# Patient Record
Sex: Female | Born: 1995 | Race: Black or African American | Hispanic: No | Marital: Single | Smoking: Current every day smoker
Health system: Southern US, Community
[De-identification: ages and names within clinical notes are randomized; demographics above are authoritative.]

## PROBLEM LIST (undated history)

## (undated) ENCOUNTER — Emergency Department (HOSPITAL_COMMUNITY): Admission: EM | Payer: Self-pay | Source: Home / Self Care

## (undated) DIAGNOSIS — F909 Attention-deficit hyperactivity disorder, unspecified type: Secondary | ICD-10-CM

---
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
## 2014-02-24 ENCOUNTER — Emergency Department (HOSPITAL_COMMUNITY)
Admission: EM | Admit: 2014-02-24 | Discharge: 2014-02-24 | Disposition: A | Discharge status: Home / Self Care | Payer: Medicaid Other | Attending: Emergency Medicine | Admitting: Emergency Medicine

## 2014-02-24 ENCOUNTER — Emergency Department (HOSPITAL_COMMUNITY): Payer: Medicaid Other

## 2014-02-24 ENCOUNTER — Encounter (HOSPITAL_COMMUNITY): Payer: Self-pay | Admitting: Emergency Medicine

## 2014-02-24 DIAGNOSIS — E860 Dehydration: Secondary | ICD-10-CM

## 2014-02-24 DIAGNOSIS — Z3202 Encounter for pregnancy test, result negative: Secondary | ICD-10-CM | POA: Insufficient documentation

## 2014-02-24 DIAGNOSIS — Z720 Tobacco use: Secondary | ICD-10-CM | POA: Insufficient documentation

## 2014-02-24 DIAGNOSIS — R1110 Vomiting, unspecified: Secondary | ICD-10-CM | POA: Insufficient documentation

## 2014-02-24 DIAGNOSIS — Z8659 Personal history of other mental and behavioral disorders: Secondary | ICD-10-CM | POA: Insufficient documentation

## 2014-02-24 DIAGNOSIS — R109 Unspecified abdominal pain: Secondary | ICD-10-CM

## 2014-02-24 DIAGNOSIS — K529 Noninfective gastroenteritis and colitis, unspecified: Secondary | ICD-10-CM | POA: Diagnosis not present

## 2014-02-24 LAB — CBC WITH DIFFERENTIAL/PLATELET
Basophils Absolute: 0 10*3/uL (ref 0.0–0.1)
Basophils Relative: 0 % (ref 0–1)
EOS PCT: 4 % (ref 0–5)
Eosinophils Absolute: 0.4 10*3/uL (ref 0.0–0.7)
HCT: 38.5 % (ref 36.0–46.0)
HEMOGLOBIN: 13.5 g/dL (ref 12.0–15.0)
LYMPHS ABS: 3.5 10*3/uL (ref 0.7–4.0)
Lymphocytes Relative: 35 % (ref 12–46)
MCH: 30.9 pg (ref 26.0–34.0)
MCHC: 35.1 g/dL (ref 30.0–36.0)
MCV: 88.1 fL (ref 78.0–100.0)
Monocytes Absolute: 0.6 10*3/uL (ref 0.1–1.0)
Monocytes Relative: 6 % (ref 3–12)
NEUTROS ABS: 5.6 10*3/uL (ref 1.7–7.7)
NEUTROS PCT: 55 % (ref 43–77)
PLATELETS: 246 10*3/uL (ref 150–400)
RBC: 4.37 MIL/uL (ref 3.87–5.11)
RDW: 14 % (ref 11.5–15.5)
WBC: 10.1 10*3/uL (ref 4.0–10.5)

## 2014-02-24 LAB — URINALYSIS, ROUTINE W REFLEX MICROSCOPIC
GLUCOSE, UA: NEGATIVE mg/dL
Hgb urine dipstick: NEGATIVE
Ketones, ur: NEGATIVE mg/dL
Nitrite: NEGATIVE
Protein, ur: NEGATIVE mg/dL
Specific Gravity, Urine: 1.026 (ref 1.005–1.030)
UROBILINOGEN UA: 1 mg/dL (ref 0.0–1.0)
pH: 5.5 (ref 5.0–8.0)

## 2014-02-24 LAB — COMPREHENSIVE METABOLIC PANEL
ALBUMIN: 3.6 g/dL (ref 3.5–5.2)
ALK PHOS: 54 U/L (ref 39–117)
ALT: 13 U/L (ref 0–35)
ANION GAP: 6 (ref 5–15)
AST: 20 U/L (ref 0–37)
BUN: 7 mg/dL (ref 6–23)
CO2: 23 mmol/L (ref 19–32)
Calcium: 8.8 mg/dL (ref 8.4–10.5)
Chloride: 108 mEq/L (ref 96–112)
Creatinine, Ser: 0.89 mg/dL (ref 0.50–1.10)
Glucose, Bld: 100 mg/dL — ABNORMAL HIGH (ref 70–99)
POTASSIUM: 3.7 mmol/L (ref 3.5–5.1)
Sodium: 137 mmol/L (ref 135–145)
TOTAL PROTEIN: 6.8 g/dL (ref 6.0–8.3)
Total Bilirubin: 0.6 mg/dL (ref 0.3–1.2)

## 2014-02-24 LAB — LIPASE, BLOOD: Lipase: 291 U/L — ABNORMAL HIGH (ref 11–59)

## 2014-02-24 LAB — URINE MICROSCOPIC-ADD ON

## 2014-02-24 LAB — PREGNANCY, URINE: Preg Test, Ur: NEGATIVE

## 2014-02-24 MED ORDER — ONDANSETRON HCL 4 MG/2ML IJ SOLN
4.0000 mg | Freq: Once | INTRAMUSCULAR | Status: AC
  Administered 2014-02-24: 4 mg via INTRAVENOUS
  Filled 2014-02-24: qty 2

## 2014-02-24 MED ORDER — IOHEXOL 300 MG/ML  SOLN
25.0000 mL | Freq: Once | INTRAMUSCULAR | Status: AC | PRN
  Administered 2014-02-24: 25 mL via ORAL

## 2014-02-24 MED ORDER — PROMETHAZINE HCL 25 MG PO TABS: 25.0000 mg | ORAL_TABLET | Freq: Four times a day (QID) | ORAL | Status: AC | PRN

## 2014-02-24 MED ORDER — MORPHINE SULFATE 4 MG/ML IJ SOLN
4.0000 mg | Freq: Once | INTRAMUSCULAR | Status: AC
  Administered 2014-02-24: 4 mg via INTRAVENOUS
  Filled 2014-02-24: qty 1

## 2014-02-24 MED ORDER — IOHEXOL 300 MG/ML  SOLN
100.0000 mL | Freq: Once | INTRAMUSCULAR | Status: AC | PRN
  Administered 2014-02-24: 100 mL via INTRAVENOUS

## 2014-02-24 MED ORDER — SODIUM CHLORIDE 0.9 % IV BOLUS (SEPSIS)
1000.0000 mL | Freq: Once | INTRAVENOUS | Status: AC
  Administered 2014-02-24: 1000 mL via INTRAVENOUS

## 2014-02-24 MED ORDER — HYDROCODONE-ACETAMINOPHEN 5-325 MG PO TABS: 1.0000 | ORAL_TABLET | Freq: Four times a day (QID) | ORAL | Status: AC | PRN

## 2014-02-24 NOTE — Discharge Instructions (Signed)
Take phenergan for nausea.   Stay hydrated.   Take motrin for pain.   Take vicodin for severe pain.   Follow up with your doctor.   Return to ER if you have worse vomiting, severe pain, fevers.

## 2014-02-24 NOTE — ED Provider Notes (Signed)
CSN: [id number]     Arrival date & time 02/24/14  0202 History  This chart was scribed for Richardean Canal, MD by Murriel Hopper, ED Scribe. This patient was seen in room B19C/B19C and the patient's care was started at 3:05 AM.    Chief Complaint  Patient presents with  . Abdominal Pain    The history is provided by the patient. No language interpreter was used.    HPI Comments: Paula Beck is a 19 y.o. female who presents to the Emergency Department complaining of constant, central abdominal pain with associated vomiting that began yesterday evening while she was sleeping. Pt states that she went to bed at 9 PM yesterday and was awoken by abdominal pain two hours later. Pt states that she vomited once PTA. Pt denies diarrhea, dysuria, or any positive sick contact. Pt states that she smokes cigarettes and marijuana but denies alcohol use.      Past Medical History  Diagnosis Date  . ADHD (attention deficit hyperactivity disorder) 01/30/2012   History reviewed. No pertinent past surgical history. Family History  Problem Relation Age of Onset  . Alcohol abuse Mother   . Drug abuse Mother   . Alcohol abuse Father   . Drug abuse Father   . Drug abuse Maternal Aunt   . Drug abuse Maternal Uncle   . Diabetes Maternal Grandmother    History  Substance Use Topics  . Smoking status: Current Every Day Smoker -- 0.50 packs/day    Types: Cigarettes  . Smokeless tobacco: Never Used  . Alcohol Use: No   OB History    No data available     Review of Systems  Gastrointestinal: Positive for vomiting and abdominal pain. Negative for diarrhea.  Genitourinary: Negative for dysuria.  All other systems reviewed and are negative.     Allergies  Review of patient's allergies indicates no known allergies.  Home Medications   Prior to Admission medications   Medication Sig Start Date End Date Taking? Authorizing Provider  ibuprofen (ADVIL,MOTRIN) 800 MG tablet Take 1 tablet (800 mg  total) by mouth 3 (three) times daily. Patient taking differently: Take 800 mg by mouth every 6 (six) hours as needed for moderate pain.  04/28/13  Yes Jennifer L Piepenbrink, PA-C   BP 108/53 mmHg  Pulse 72  Temp(Src) 97.5 F (36.4 C) (Oral)  Resp 14  SpO2 99%  LMP 01/01/2014 (Approximate) Physical Exam  Constitutional: She is oriented to person, place, and time. She appears well-developed and well-nourished.  HENT:  Head: Normocephalic and atraumatic.  Cardiovascular: Normal rate and regular rhythm.   Pulmonary/Chest: Effort normal.  Abdominal: She exhibits no distension.  Diffusely tender in upper abdomen No rebound Mild periumbilical tenderness   Neurological: She is alert and oriented to person, place, and time.  Skin: Skin is warm and dry.  Psychiatric: She has a normal mood and affect.  Nursing note and vitals reviewed.   ED Course  Procedures (including critical care time)  DIAGNOSTIC STUDIES: Oxygen Saturation is 98% on RA, normal by my interpretation.    COORDINATION OF CARE: 3:08 AM Discussed treatment plan with pt at bedside and pt agreed to plan.   Labs Review Labs Reviewed  COMPREHENSIVE METABOLIC PANEL - Abnormal; Notable for the following:    Glucose, Bld 100 (*)    All other components within normal limits  LIPASE, BLOOD - Abnormal; Notable for the following:    Lipase 291 (*)    All other components within normal  limits  URINALYSIS, ROUTINE W REFLEX MICROSCOPIC - Abnormal; Notable for the following:    APPearance TURBID (*)    Bilirubin Urine SMALL (*)    Leukocytes, UA SMALL (*)    All other components within normal limits  URINE MICROSCOPIC-ADD ON - Abnormal; Notable for the following:    Squamous Epithelial / LPF MANY (*)    Bacteria, UA MANY (*)    All other components within normal limits  CBC WITH DIFFERENTIAL  PREGNANCY, URINE    Imaging Review Ct Abdomen Pelvis W Contrast  02/24/2014   CLINICAL DATA:  Woke up with mid abdominal pain  and vomiting.  EXAM: CT ABDOMEN AND PELVIS WITH CONTRAST  TECHNIQUE: Multidetector CT imaging of the abdomen and pelvis was performed using the standard protocol following bolus administration of intravenous contrast.  CONTRAST:  100mL OMNIPAQUE IOHEXOL 300 MG/ML  SOLN  COMPARISON:  None.  FINDINGS: LUNG BASES: Included view of the lung bases are clear. Visualized heart and pericardium are unremarkable.  SOLID ORGANS: The liver, spleen, gallbladder, pancreas and adrenal glands are unremarkable.  GASTROINTESTINAL TRACT: Mildly thick walled loops of small bowel in the RIGHT lower quadrant, no small bowel distension. The stomach, and large bowel are normal in course and caliber without inflammatory changes. Normal appendix.  KIDNEYS/ URINARY TRACT: Kidneys are orthotopic, demonstrating symmetric enhancement. No nephrolithiasis, hydronephrosis or solid renal masses. The unopacified ureters are normal in course and caliber. Urinary bladder is partially distended and unremarkable.  PERITONEUM/RETROPERITONEUM: Small amount of free fluid in the pelvis. Aortoiliac vessels are normal in course and caliber. No lymphadenopathy by CT size criteria. Internal reproductive organs are unremarkable.  SOFT TISSUE/OSSEOUS STRUCTURES: Nonsuspicious.  IMPRESSION: Mildly thickened small bowel suggests enteritis (infectious or inflammatory) without bowel obstruction.  Small amount of free fluid may be reactive or physiologic. Normal appendix.   Electronically Signed   By: Awilda Metroourtnay  Bloomer   On: 02/24/2014 04:54     EKG Interpretation None      MDM   Final diagnoses:  Abdominal pain   Paula Beck is a 19 y.o. female here with ab pain, vomiting. Has some periumbilical tenderness. Consider gastro vs early appy. Will get CT ab/pel, labs, UA.   6:01 AM Labs showed lipase 291. CT showed no pancreatitis. Just some enteritis. She has no recent travel and no leukocytosis. I think enteritis likely viral. Will dc home with  phenergan, prn vicodin for pain.   I personally performed the services described in this documentation, which was scribed in my presence. The recorded information has been reviewed and is accurate.    Richardean Canalavid H Carrissa Taitano, MD 02/24/14 [phone]

## 2014-02-24 NOTE — ED Notes (Signed)
CT notified that pt finished contrast ?

## 2014-02-24 NOTE — ED Notes (Signed)
Pt. woke up this evening with mid abdominal pain and emesis ( x1) , denies diarrhea , no fever or chills.

## 2014-03-31 ENCOUNTER — Emergency Department (HOSPITAL_COMMUNITY)
Admission: EM | Admit: 2014-03-31 | Discharge: 2014-03-31 | Disposition: A | Discharge status: Home / Self Care | Payer: Medicaid Other | Attending: Emergency Medicine | Admitting: Emergency Medicine

## 2014-03-31 ENCOUNTER — Encounter (HOSPITAL_COMMUNITY): Payer: Self-pay | Admitting: Emergency Medicine

## 2014-03-31 DIAGNOSIS — Y9389 Activity, other specified: Secondary | ICD-10-CM | POA: Diagnosis not present

## 2014-03-31 DIAGNOSIS — M545 Low back pain, unspecified: Secondary | ICD-10-CM

## 2014-03-31 DIAGNOSIS — Y92410 Unspecified street and highway as the place of occurrence of the external cause: Secondary | ICD-10-CM | POA: Diagnosis not present

## 2014-03-31 DIAGNOSIS — S3992XA Unspecified injury of lower back, initial encounter: Secondary | ICD-10-CM | POA: Insufficient documentation

## 2014-03-31 DIAGNOSIS — Z8659 Personal history of other mental and behavioral disorders: Secondary | ICD-10-CM | POA: Insufficient documentation

## 2014-03-31 DIAGNOSIS — Y998 Other external cause status: Secondary | ICD-10-CM | POA: Diagnosis not present

## 2014-03-31 DIAGNOSIS — Z720 Tobacco use: Secondary | ICD-10-CM | POA: Insufficient documentation

## 2014-03-31 MED ORDER — IBUPROFEN 800 MG PO TABS: 800.0000 mg | ORAL_TABLET | Freq: Three times a day (TID) | ORAL | Status: AC
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
## 2014-03-31 NOTE — Discharge Instructions (Signed)
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
## 2015-08-02 ENCOUNTER — Emergency Department (HOSPITAL_COMMUNITY)
Admission: EM | Admit: 2015-08-02 | Discharge: 2015-08-02 | Disposition: A | Discharge status: Home / Self Care | Payer: No Typology Code available for payment source | Attending: Emergency Medicine | Admitting: Emergency Medicine

## 2015-08-02 ENCOUNTER — Encounter (HOSPITAL_COMMUNITY): Payer: Self-pay | Admitting: Neurology

## 2015-08-02 DIAGNOSIS — R112 Nausea with vomiting, unspecified: Secondary | ICD-10-CM

## 2015-08-02 DIAGNOSIS — Z79899 Other long term (current) drug therapy: Secondary | ICD-10-CM | POA: Insufficient documentation

## 2015-08-02 DIAGNOSIS — F17210 Nicotine dependence, cigarettes, uncomplicated: Secondary | ICD-10-CM | POA: Insufficient documentation

## 2015-08-02 DIAGNOSIS — F909 Attention-deficit hyperactivity disorder, unspecified type: Secondary | ICD-10-CM | POA: Insufficient documentation

## 2015-08-02 DIAGNOSIS — R197 Diarrhea, unspecified: Secondary | ICD-10-CM | POA: Insufficient documentation

## 2015-08-02 LAB — URINE MICROSCOPIC-ADD ON

## 2015-08-02 LAB — CBC
HCT: 37.5 % (ref 36.0–46.0)
HEMOGLOBIN: 12.6 g/dL (ref 12.0–15.0)
MCH: 30.2 pg (ref 26.0–34.0)
MCHC: 33.6 g/dL (ref 30.0–36.0)
MCV: 89.9 fL (ref 78.0–100.0)
PLATELETS: 248 10*3/uL (ref 150–400)
RBC: 4.17 MIL/uL (ref 3.87–5.11)
RDW: 13 % (ref 11.5–15.5)
WBC: 8.8 10*3/uL (ref 4.0–10.5)

## 2015-08-02 LAB — COMPREHENSIVE METABOLIC PANEL
ALK PHOS: 50 U/L (ref 38–126)
ALT: 13 U/L — ABNORMAL LOW (ref 14–54)
ANION GAP: 8 (ref 5–15)
AST: 19 U/L (ref 15–41)
Albumin: 4 g/dL (ref 3.5–5.0)
BILIRUBIN TOTAL: 1 mg/dL (ref 0.3–1.2)
BUN: 7 mg/dL (ref 6–20)
CALCIUM: 9.3 mg/dL (ref 8.9–10.3)
CO2: 23 mmol/L (ref 22–32)
Chloride: 106 mmol/L (ref 101–111)
Creatinine, Ser: 0.95 mg/dL (ref 0.44–1.00)
GFR calc Af Amer: >60 mL/min (ref >60)
GFR calc non Af Amer: >60 mL/min (ref >60)
Glucose, Bld: 108 mg/dL — ABNORMAL HIGH (ref 65–99)
Potassium: 3.3 mmol/L — ABNORMAL LOW (ref 3.5–5.1)
Sodium: 137 mmol/L (ref 135–145)
TOTAL PROTEIN: 7 g/dL (ref 6.5–8.1)

## 2015-08-02 LAB — URINALYSIS, ROUTINE W REFLEX MICROSCOPIC
Glucose, UA: NEGATIVE mg/dL
Hgb urine dipstick: NEGATIVE
KETONES UR: 40 mg/dL — AB
NITRITE: NEGATIVE
PROTEIN: NEGATIVE mg/dL
Specific Gravity, Urine: 1.025 (ref 1.005–1.030)
pH: 5.5 (ref 5.0–8.0)

## 2015-08-02 LAB — I-STAT BETA HCG BLOOD, ED (MC, WL, AP ONLY): I-stat hCG, quantitative: <5 m[IU]/mL (ref <5)

## 2015-08-02 LAB — LIPASE, BLOOD: Lipase: 18 U/L (ref 11–51)

## 2015-08-02 NOTE — ED Provider Notes (Signed)
CSN: [id number]     Arrival date & time 08/02/15  1058 History   First MD Initiated Contact with Patient 08/02/15 1154     No chief complaint on file.    (Consider location/radiation/quality/duration/timing/severity/associated sxs/prior Treatment) Patient is a 20 y.o. female presenting with weakness. The history is provided by the patient.  Weakness This is a new problem. The current episode started 6 to 12 hours ago. The problem occurs constantly. The problem has not changed since onset.Associated symptoms include abdominal pain (occasional epigastric). Associated symptoms comments: Diarrhea x2 days. Nothing aggravates the symptoms. Nothing relieves the symptoms. She has tried nothing for the symptoms.    Past Medical History  Diagnosis Date  . ADHD (attention deficit hyperactivity disorder) 01/30/2012   History reviewed. No pertinent past surgical history. Family History  Problem Relation Age of Onset  . Alcohol abuse Mother   . Drug abuse Mother   . Alcohol abuse Father   . Drug abuse Father   . Drug abuse Maternal Aunt   . Drug abuse Maternal Uncle   . Diabetes Maternal Grandmother    Social History  Substance Use Topics  . Smoking status: Current Every Day Smoker -- 0.50 packs/day    Types: Cigarettes  . Smokeless tobacco: Never Used  . Alcohol Use: No   OB History    No data available     Review of Systems  Gastrointestinal: Positive for abdominal pain (occasional epigastric).  Neurological: Positive for weakness.  All other systems reviewed and are negative.     Allergies  Review of patient's allergies indicates no known allergies.  Home Medications   Prior to Admission medications   Medication Sig Start Date End Date Taking? Authorizing Provider  HYDROcodone-acetaminophen (NORCO/VICODIN) 5-325 MG per tablet Take 1-2 tablets by mouth every 6 (six) hours as needed for moderate pain or severe pain. 02/24/14   Richardean Canalavid H Yao, MD  ibuprofen (ADVIL,MOTRIN) 800 MG  tablet Take 1 tablet (800 mg total) by mouth 3 (three) times daily. 03/31/14   Elson AreasLeslie K Sofia, PA-C  promethazine (PHENERGAN) 25 MG tablet Take 1 tablet (25 mg total) by mouth every 6 (six) hours as needed for nausea or vomiting. 02/24/14   Richardean Canalavid H Yao, MD   BP 121/73 mmHg  Pulse 99  Temp(Src) 98.6 F (37 C) (Oral)  Resp 20  SpO2 98%  LMP 07/02/2015 Physical Exam  Constitutional: She is oriented to person, place, and time. She appears well-developed and well-nourished. No distress.  HENT:  Head: Normocephalic.  Eyes: Conjunctivae are normal.  Neck: Neck supple. No tracheal deviation present.  Cardiovascular: Normal rate, regular rhythm and normal heart sounds.   Pulmonary/Chest: Effort normal and breath sounds normal. No respiratory distress.  Abdominal: Soft. She exhibits no distension. There is no tenderness. There is no rebound and no guarding.  Neurological: She is alert and oriented to person, place, and time.  Skin: Skin is warm and dry.  Psychiatric: She has a normal mood and affect.  Vitals reviewed.   ED Course  Procedures (including critical care time) Labs Review Labs Reviewed  COMPREHENSIVE METABOLIC PANEL - Abnormal; Notable for the following:    Potassium 3.3 (*)    Glucose, Bld 108 (*)    ALT 13 (*)    All other components within normal limits  LIPASE, BLOOD  CBC  URINALYSIS, ROUTINE W REFLEX MICROSCOPIC (NOT AT [facility])  I-STAT BETA HCG BLOOD, ED ([facility], [facility], [facility] ONLY)    Imaging Review No results found. I  have personally reviewed and evaluated these images and lab results as part of my medical decision-making.   EKG Interpretation None      MDM   Final diagnoses:  Nausea vomiting and diarrhea    20 year old female presents with intermittent vague abdominal pain for multiple days. She had several episodes of diarrhea today and 2 episodes of vomiting. She is afebrile, no significant medical history. No clinical evidence or laboratory abnormalities to suggest  appendicitis, cholecystitis or other surgical emergency. No indication for imaging right now. I recommended the patient take over-the-counter Pepto-Bismol and follow up with her primary care physician.    Lyndal Pulley, MD 08/02/15 [phone]

## 2015-08-02 NOTE — ED Notes (Signed)
Reports vomiting x 1 this morning went to work, c/o middle abd pain. Also has diarrhea.

## 2015-08-02 NOTE — Discharge Instructions (Signed)

## 2015-12-05 IMAGING — CT CT ABD-PELV W/ CM
2 of 4 series · 16 of 46 positions shown, 18 images · IV contrast (omnipaque)
Comparison: None.

CLINICAL DATA: Woke up with mid abdominal pain and vomiting.

EXAM:
CT ABDOMEN AND PELVIS WITH CONTRAST
TECHNIQUE: Multidetector CT imaging of the abdomen and pelvis was performed
using the standard protocol following bolus administration of
intravenous contrast.
CONTRAST:  100mL OMNIPAQUE IOHEXOL 300 MG/ML  SOLN

[Series 2: abd/ pelvis 5.0 i30f 1 · axial · 0.77mm/px · z∈[-514,-39]mm · 13 of 105 slices shown, 15 images]
[im 5/105  soft-tissue]
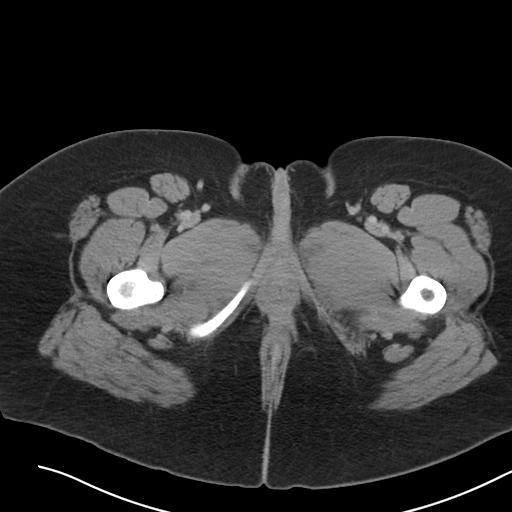
[im 5/105  bone]
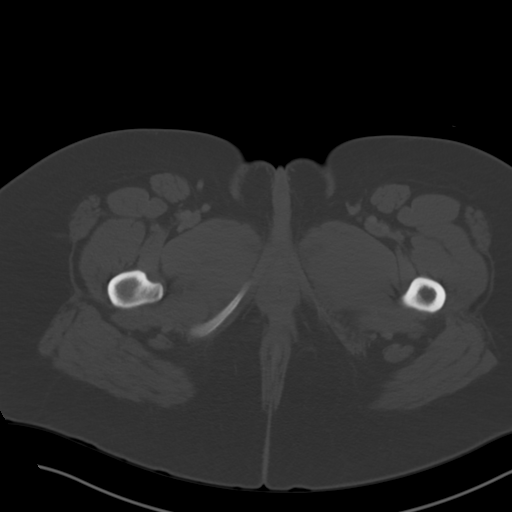
[im 14/105  soft-tissue]
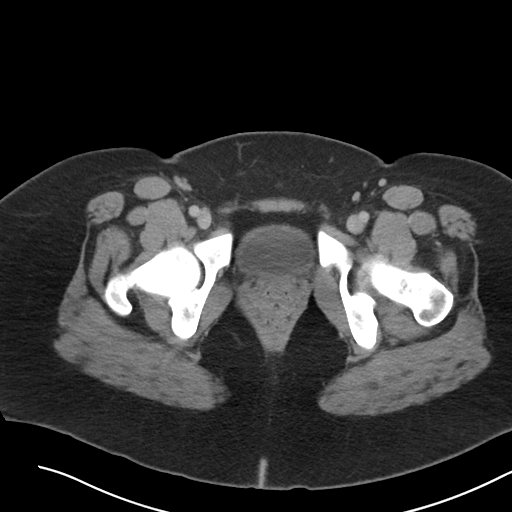
[im 23/105  soft-tissue]
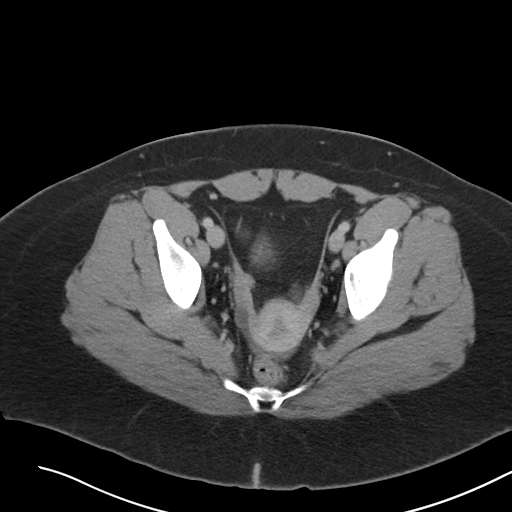
[im 28/105  soft-tissue]
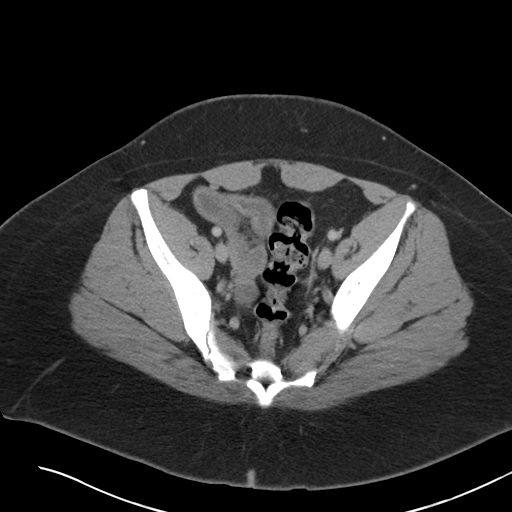
[im 37/105  soft-tissue]
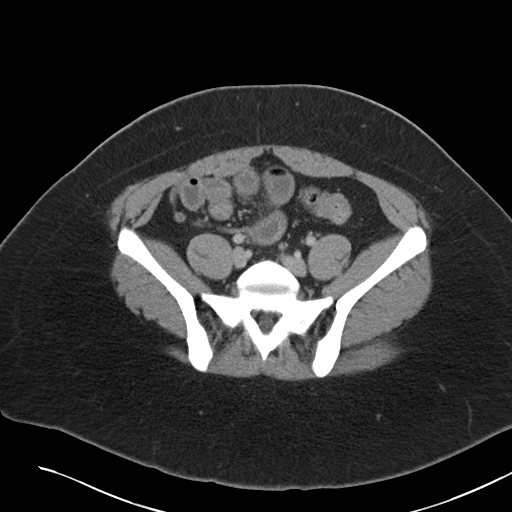
[im 46/105  soft-tissue]
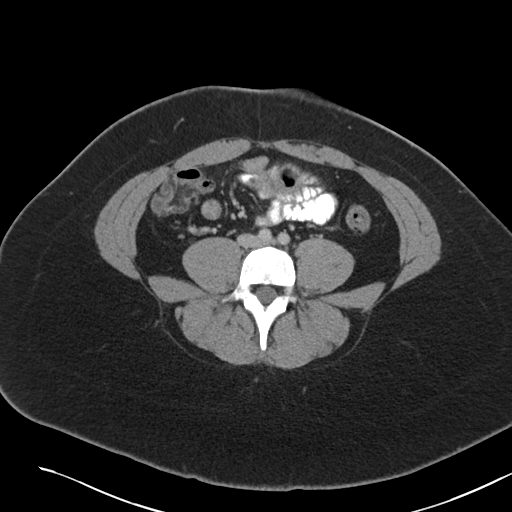
[im 55/105  soft-tissue]
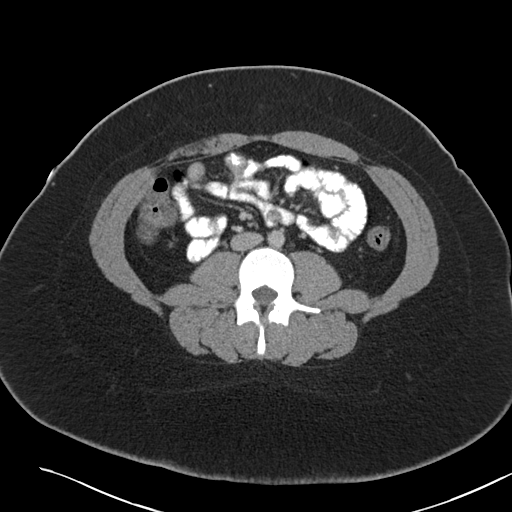
[im 59/105  soft-tissue]
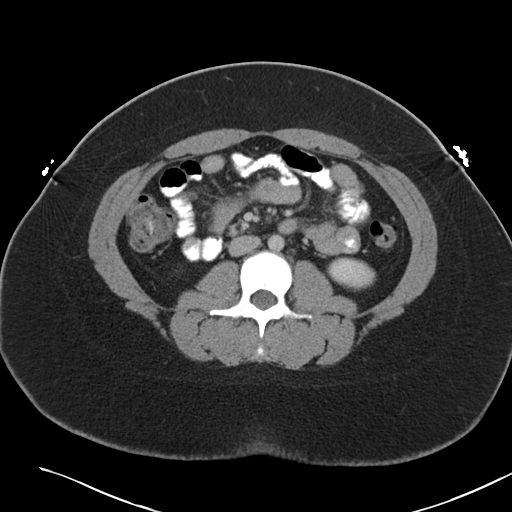
[im 68/105  soft-tissue]
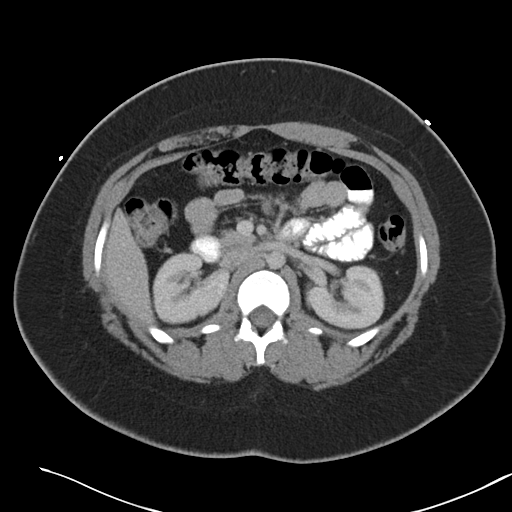
[im 68/105  bone]
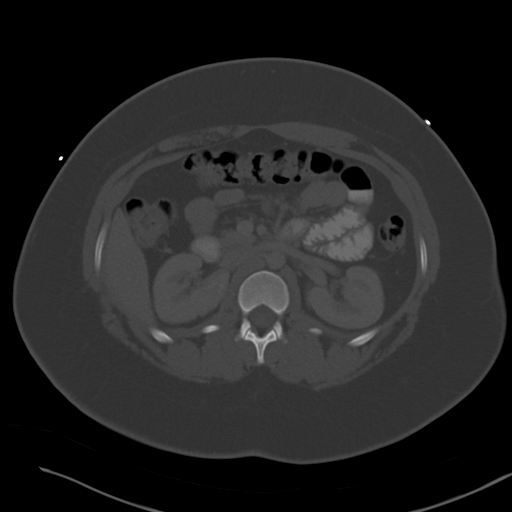
[im 77/105  soft-tissue]
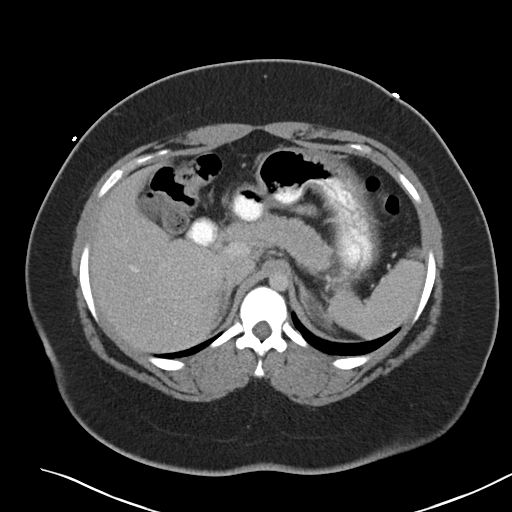
[im 82/105  soft-tissue]
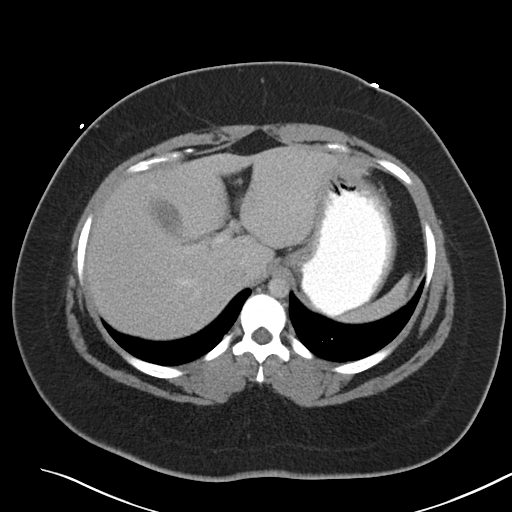
[im 91/105  soft-tissue]
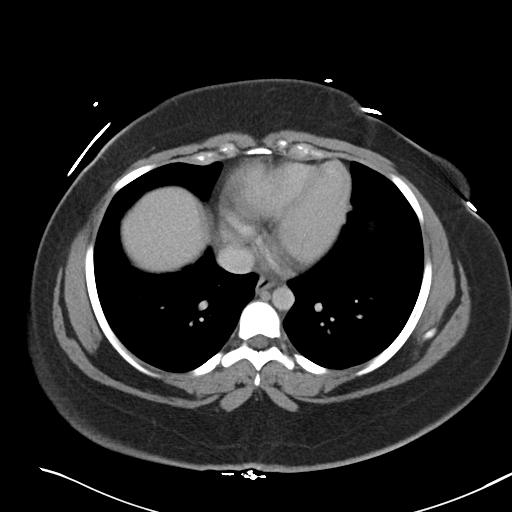
[im 100/105  soft-tissue]
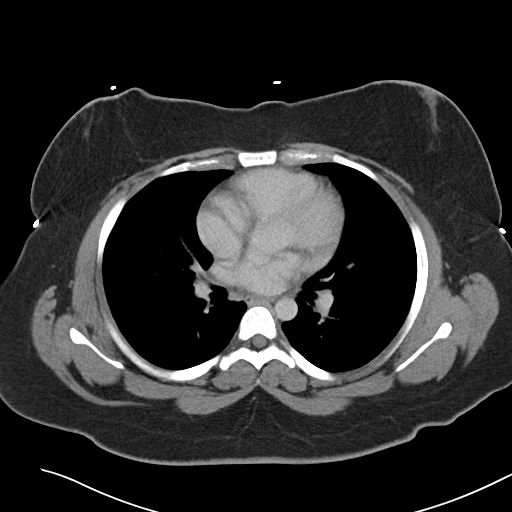

[Series 5: coronals · coronal · 0.75mm/px · 3 of 117 slices shown]
[im 39/117  soft-tissue]
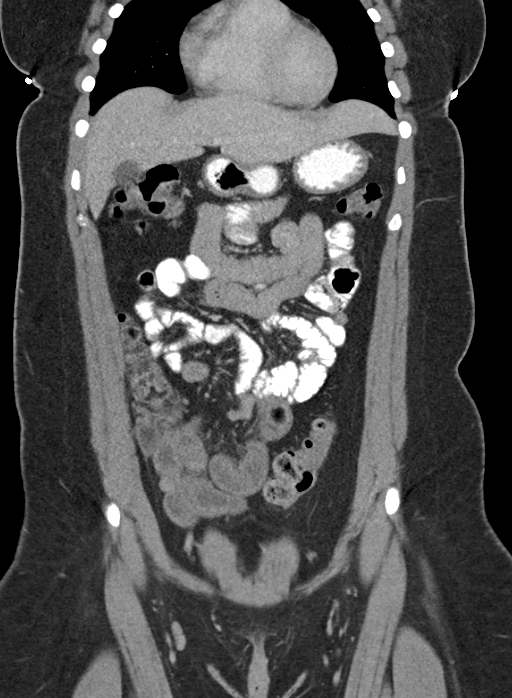
[im 52/117  soft-tissue]
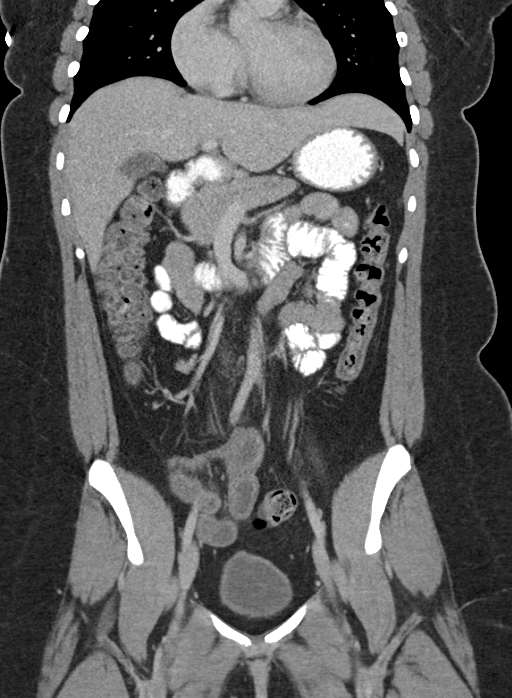
[im 65/117  soft-tissue]
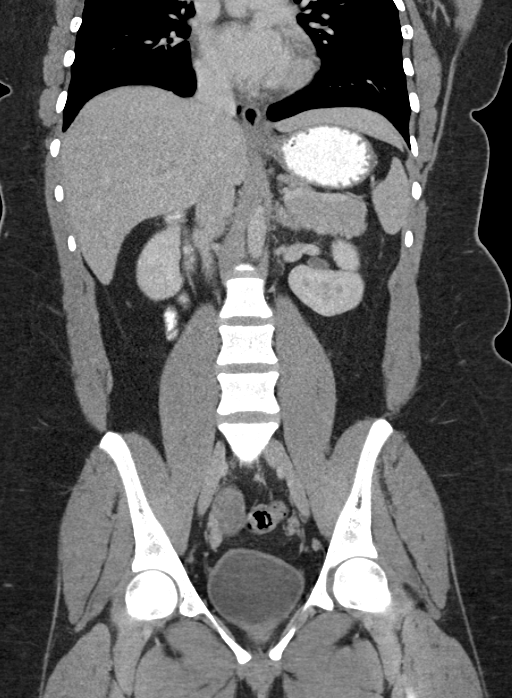

[16 of 46 positions shown; findings below may reference images not displayed]

FINDINGS: LUNG BASES: Included view of the lung bases are clear. Visualized
heart and pericardium are unremarkable.

SOLID ORGANS: The liver, spleen, gallbladder, pancreas and adrenal
glands are unremarkable.

GASTROINTESTINAL TRACT: Mildly thick walled loops of small bowel in
the RIGHT lower quadrant, no small bowel distension. The stomach,
and large bowel are normal in course and caliber without
inflammatory changes. Normal appendix.

KIDNEYS/ URINARY TRACT: Kidneys are orthotopic, demonstrating
symmetric enhancement. No nephrolithiasis, hydronephrosis or solid
renal masses. The unopacified ureters are normal in course and
caliber. Urinary bladder is partially distended and unremarkable.

PERITONEUM/RETROPERITONEUM: Small amount of free fluid in the
pelvis. Aortoiliac vessels are normal in course and caliber. No
lymphadenopathy by CT size criteria. Internal reproductive organs
are unremarkable.

SOFT TISSUE/OSSEOUS STRUCTURES: Nonsuspicious.
IMPRESSION: Mildly thickened small bowel suggests enteritis (infectious or
inflammatory) without bowel obstruction.

Small amount of free fluid may be reactive or physiologic. Normal
appendix.

  By: Laaouina Tiger
